# Patient Record
Sex: Male | Born: 1965 | Race: Black or African American | Hispanic: No | Marital: Single
Health system: Northeastern US, Academic
[De-identification: ages and names within clinical notes are randomized; demographics above are authoritative.]

## PROBLEM LIST (undated history)

## (undated) DIAGNOSIS — Z87891 Personal history of nicotine dependence: Secondary | ICD-10-CM

## (undated) DIAGNOSIS — Z87898 Personal history of other specified conditions: Secondary | ICD-10-CM

## (undated) DIAGNOSIS — I10 Essential (primary) hypertension: Secondary | ICD-10-CM

## (undated) DIAGNOSIS — I219 Acute myocardial infarction, unspecified: Secondary | ICD-10-CM

## (undated) DIAGNOSIS — E7800 Pure hypercholesterolemia, unspecified: Secondary | ICD-10-CM

---

## 2014-07-09 ENCOUNTER — Encounter (HOSPITAL_COMMUNITY): Payer: Self-pay | Admitting: *Deleted

## 2014-07-09 ENCOUNTER — Observation Stay (HOSPITAL_COMMUNITY)
Admission: EM | Admit: 2014-07-09 | Discharge: 2014-07-11 | Disposition: A | Discharge status: Home / Self Care | Payer: Self-pay | Attending: Internal Medicine | Admitting: Internal Medicine

## 2014-07-09 ENCOUNTER — Emergency Department (HOSPITAL_COMMUNITY)
Admission: EM | Admit: 2014-07-09 | Discharge: 2014-07-09 | Disposition: A | Discharge status: Home / Self Care | Payer: Self-pay | Attending: Emergency Medicine | Admitting: Emergency Medicine

## 2014-07-09 ENCOUNTER — Encounter (HOSPITAL_COMMUNITY): Payer: Self-pay | Admitting: Emergency Medicine

## 2014-07-09 DIAGNOSIS — I252 Old myocardial infarction: Secondary | ICD-10-CM | POA: Insufficient documentation

## 2014-07-09 DIAGNOSIS — Z7982 Long term (current) use of aspirin: Secondary | ICD-10-CM | POA: Insufficient documentation

## 2014-07-09 DIAGNOSIS — Z8639 Personal history of other endocrine, nutritional and metabolic disease: Secondary | ICD-10-CM | POA: Insufficient documentation

## 2014-07-09 DIAGNOSIS — I10 Essential (primary) hypertension: Secondary | ICD-10-CM | POA: Insufficient documentation

## 2014-07-09 DIAGNOSIS — Z79899 Other long term (current) drug therapy: Secondary | ICD-10-CM | POA: Insufficient documentation

## 2014-07-09 DIAGNOSIS — Z87891 Personal history of nicotine dependence: Secondary | ICD-10-CM | POA: Insufficient documentation

## 2014-07-09 DIAGNOSIS — R0789 Other chest pain: Secondary | ICD-10-CM | POA: Insufficient documentation

## 2014-07-09 DIAGNOSIS — F39 Unspecified mood [affective] disorder: Secondary | ICD-10-CM | POA: Diagnosis present

## 2014-07-09 DIAGNOSIS — E780 Pure hypercholesterolemia: Secondary | ICD-10-CM | POA: Insufficient documentation

## 2014-07-09 DIAGNOSIS — I2510 Atherosclerotic heart disease of native coronary artery without angina pectoris: Secondary | ICD-10-CM | POA: Diagnosis present

## 2014-07-09 DIAGNOSIS — I25119 Atherosclerotic heart disease of native coronary artery with unspecified angina pectoris: Secondary | ICD-10-CM

## 2014-07-09 DIAGNOSIS — R079 Chest pain, unspecified: Principal | ICD-10-CM | POA: Diagnosis present

## 2014-07-09 HISTORY — DX: Essential (primary) hypertension: I10

## 2014-07-09 HISTORY — DX: Acute myocardial infarction, unspecified: I21.9

## 2014-07-09 HISTORY — DX: Personal history of nicotine dependence: Z87.891

## 2014-07-09 HISTORY — DX: Personal history of other specified conditions: Z87.898

## 2014-07-09 HISTORY — DX: Pure hypercholesterolemia, unspecified: E78.00

## 2014-07-09 LAB — CBC
HCT: 41.7 % (ref 39.0–52.0)
HEMOGLOBIN: 14.4 g/dL (ref 13.0–17.0)
MCH: 32.1 pg (ref 26.0–34.0)
MCHC: 34.5 g/dL (ref 30.0–36.0)
MCV: 93.1 fL (ref 78.0–100.0)
Platelets: 247 10*3/uL (ref 150–400)
RBC: 4.48 MIL/uL (ref 4.22–5.81)
RDW: 13.8 % (ref 11.5–15.5)
WBC: 7.8 10*3/uL (ref 4.0–10.5)

## 2014-07-09 LAB — BASIC METABOLIC PANEL
Anion gap: 11 (ref 5–15)
BUN: 12 mg/dL (ref 6–23)
CO2: 25 meq/L (ref 19–32)
Calcium: 9.6 mg/dL (ref 8.4–10.5)
Chloride: 101 mEq/L (ref 96–112)
Creatinine, Ser: 0.92 mg/dL (ref 0.50–1.35)
GFR calc non Af Amer: >90 mL/min (ref >90)
Glucose, Bld: 111 mg/dL — ABNORMAL HIGH (ref 70–99)
POTASSIUM: 4.1 meq/L (ref 3.7–5.3)
Sodium: 137 mEq/L (ref 137–147)

## 2014-07-09 LAB — I-STAT TROPONIN, ED: Troponin i, poc: 0.01 ng/mL (ref 0.00–0.08)

## 2014-07-09 LAB — TROPONIN I: Troponin I: <0.3 ng/mL (ref <0.30)

## 2014-07-09 MED ORDER — MORPHINE SULFATE 2 MG/ML IJ SOLN: 2.0000 mg | INTRAMUSCULAR | Status: DC | PRN

## 2014-07-09 MED ORDER — MORPHINE SULFATE 4 MG/ML IJ SOLN
4.0000 mg | INTRAMUSCULAR | Status: DC | PRN
  Administered 2014-07-09 (×2): 4 mg via INTRAVENOUS
  Filled 2014-07-09 (×2): qty 1

## 2014-07-09 MED ORDER — NITROGLYCERIN 0.4 MG SL SUBL
0.4000 mg | SUBLINGUAL_TABLET | SUBLINGUAL | Status: DC | PRN
  Administered 2014-07-09: 0.4 mg via SUBLINGUAL
  Filled 2014-07-09: qty 1

## 2014-07-09 MED ORDER — MORPHINE SULFATE 4 MG/ML IJ SOLN
4.0000 mg | Freq: Once | INTRAMUSCULAR | Status: AC
  Administered 2014-07-09: 4 mg via INTRAVENOUS
  Filled 2014-07-09: qty 1

## 2014-07-09 MED ORDER — ACETAMINOPHEN 325 MG PO TABS: 650.0000 mg | ORAL_TABLET | ORAL | Status: DC | PRN

## 2014-07-09 MED ORDER — MORPHINE SULFATE 4 MG/ML IJ SOLN
4.0000 mg | Freq: Once | INTRAMUSCULAR | Status: AC
Start: 2014-07-09 — End: 2014-07-09
  Administered 2014-07-09: 4 mg via INTRAVENOUS
  Filled 2014-07-09: qty 1

## 2014-07-09 MED ORDER — NITROGLYCERIN 0.4 MG SL SUBL
0.4000 mg | SUBLINGUAL_TABLET | SUBLINGUAL | Status: DC | PRN
  Administered 2014-07-09 (×3): 0.4 mg via SUBLINGUAL

## 2014-07-09 MED ORDER — ALPRAZOLAM 0.25 MG PO TABS: 0.2500 mg | ORAL_TABLET | Freq: Two times a day (BID) | ORAL | Status: DC | PRN

## 2014-07-09 MED ORDER — ASPIRIN EC 81 MG PO TBEC
81.0000 mg | DELAYED_RELEASE_TABLET | Freq: Every day | ORAL | Status: DC
  Administered 2014-07-10 – 2014-07-11 (×2): 81 mg via ORAL
  Filled 2014-07-09 (×2): qty 1

## 2014-07-09 MED ORDER — PRASUGREL HCL 10 MG PO TABS
10.0000 mg | ORAL_TABLET | Freq: Every day | ORAL | Status: DC
  Administered 2014-07-10 – 2014-07-11 (×2): 10 mg via ORAL
  Filled 2014-07-09 (×2): qty 1

## 2014-07-09 MED ORDER — HEPARIN (PORCINE) IN NACL 100-0.45 UNIT/ML-% IJ SOLN
1000.0000 [IU]/h | INTRAMUSCULAR | Status: DC
  Administered 2014-07-09: 900 [IU]/h via INTRAVENOUS
  Filled 2014-07-09 (×2): qty 250

## 2014-07-09 MED ORDER — ONDANSETRON HCL 4 MG/2ML IJ SOLN
4.0000 mg | Freq: Four times a day (QID) | INTRAMUSCULAR | Status: DC | PRN
  Administered 2014-07-11: 4 mg via INTRAVENOUS
  Filled 2014-07-09: qty 2

## 2014-07-09 MED ORDER — ASPIRIN 81 MG PO CHEW
324.0000 mg | CHEWABLE_TABLET | Freq: Once | ORAL | Status: AC
  Administered 2014-07-09: 324 mg via ORAL
  Filled 2014-07-09: qty 4

## 2014-07-09 MED ORDER — HEPARIN BOLUS VIA INFUSION
4000.0000 [IU] | Freq: Once | INTRAVENOUS | Status: AC
  Administered 2014-07-09: 4000 [IU] via INTRAVENOUS
  Filled 2014-07-09: qty 4000

## 2014-07-09 NOTE — ED Notes (Signed)
Pt placed on monitor, continuous pulse oximetry and blood pressure cuff 

## 2014-07-09 NOTE — ED Notes (Signed)
Patient reports he has chest pain in left side of chest, that shoots, rating 9/10. He has an MI last month and had cardiac stent placed.  Hx of HTN and melanoma.  [facility]

## 2014-07-09 NOTE — ED Provider Notes (Signed)
Patient seen here earlier today and released complaining of chest pain which started a prospect 2 AM today. After discharge patient walked 3 city blocks became sweaty, and lightheaded, return here for further evaluation pain has been constant since 2 AM today left-sided with a mild pleuritic component worse with exertion improved with remaining still pain is moderate to severe present patient is anxious appearing lungs clear auscultation heart regular rate and rhythm abdomen nondistended nontender  Date: 07/09/2014 1606  Rate: 85  Rhythm: normal sinus rhythm  QRS Axis: normal  Intervals: normal  ST/T Wave abnormalities: Inferior ischemic changes new from EKG from earlier today  Conduction Disutrbances:none  Narrative Interpretation:   Old EKG Reviewed: changes noted   Doug SouSam Julianne Chamberlin, MD 07/09/14 1943

## 2014-07-09 NOTE — ED Notes (Signed)
The pt is asleep 

## 2014-07-09 NOTE — ED Notes (Signed)
Pt was just dc home from our department, was trying to find a ride home when pt became lightheaded, dizzy and having chest pains again. No acute distress noted at triage.

## 2014-07-09 NOTE — ED Notes (Signed)
Pt getting undressed and into a gown at this time 

## 2014-07-09 NOTE — Discharge Instructions (Signed)
As discussed, your evaluation today has been largely reassuring.  But, it is important that you monitor your condition carefully, and do not hesitate to return to the ED if you develop new, or concerning changes in your condition. ° °Otherwise, please follow-up with your physician for appropriate ongoing care. ° °Chest Pain (Nonspecific) °It is often hard to give a diagnosis for the cause of chest pain. There is always a chance that your pain could be related to something serious, such as a heart attack or a blood clot in the lungs. You need to follow up with your doctor. °HOME CARE °· If antibiotic medicine was given, take it as directed by your doctor. Finish the medicine even if you start to feel better. °· For the next few days, avoid activities that bring on chest pain. Continue physical activities as told by your doctor. °· Do not use any tobacco products. This includes cigarettes, chewing tobacco, and e-cigarettes. °· Avoid drinking alcohol. °· Only take medicine as told by your doctor. °· Follow your doctor's suggestions for more testing if your chest pain does not go away. °· Keep all doctor visits you made. °GET HELP IF: °· Your chest pain does not go away, even after treatment. °· You have a rash with blisters on your chest. °· You have a fever. °GET HELP RIGHT AWAY IF:  °· You have more pain or pain that spreads to your arm, neck, jaw, back, or belly (abdomen). °· You have shortness of breath. °· You cough more than usual or cough up blood. °· You have very bad back or belly pain. °· You feel sick to your stomach (nauseous) or throw up (vomit). °· You have very bad weakness. °· You pass out (faint). °· You have chills. °This is an emergency. Do not wait to see if the problems will go away. Call your local emergency services (911 in [place] Do not drive yourself to the hospital. °MAKE SURE YOU:  °· Understand these instructions. °· Will watch your condition. °· Will get help right away if you are not doing  well or get worse. °Document Released: 12/25/2007 Document Revised: 07/13/2013 Document Reviewed: 12/25/2007 °ExitCare® Patient Information ©2015 [employer]. This information is not intended to replace advice given to you by your health care provider. Make sure you discuss any questions you have with your health care provider. ° °

## 2014-07-09 NOTE — Progress Notes (Signed)
ANTICOAGULATION CONSULT NOTE - Initial Consult  Pharmacy Consult for heparin Indication: chest pain/ACS  No Known Allergies  Patient Measurements: Height: 5\' 10"  (177.8 cm) Weight: 160 lb (72.576 kg) IBW/kg (Calculated) : 73  Vital Signs: Temp: 97.4 F (36.3 C) (12/19 1546) BP: 103/71 mmHg (12/19 2249) Pulse Rate: 73 (12/19 2249)  Labs:  Recent Labs  07/09/14 0706 07/09/14 1125 07/09/14 1931  HGB 14.4  --   --   HCT 41.7  --   --   PLT 247  --   --   CREATININE 0.92  --   --   TROPONINI  --  <0.30 <0.30    Estimated Creatinine Clearance: 99.7 mL/min (by C-G formula based on Cr of 0.92).   Medical History: Past Medical History  Diagnosis Date  . Hypertension   . Acute MI   . High cholesterol   . Former consumption of alcohol   . Former cigar smoker     Medications:  Prescriptions prior to admission  Medication Sig Dispense Refill Last Dose  . aspirin EC 81 MG tablet Take 81 mg by mouth daily.     . haloperidol (HALDOL) 0.5 MG tablet Take 0.5 mg by mouth 2 (two) times daily.     Marland Kitchen. HYDROCHLOROTHIAZIDE PO Take by mouth.     Marland Kitchen. LITHIUM PO Take by mouth.     . Prasugrel HCl (EFFIENT PO) Take by mouth.      Scheduled:  . [START ON 07/10/2014] aspirin EC  81 mg Oral Daily  . [START ON 07/10/2014] prasugrel  10 mg Oral Daily    Assessment: 48yo male c/o CP 12mo s/p MI/stent, was discharged from ED this morning after several hours of cardiac monitoring and no evidence of ongoing coronary ischemia, was waiting for a ride home and was walking away from hospital when he became lightheaded and dizzy and CP returned, troponin negative x3, now to be admitted and to begin heparin.  Goal of Therapy:  Heparin level 0.3-0.7 units/ml Monitor platelets by anticoagulation protocol: Yes   Plan:  Will give heparin 4000 units x1 followed by gtt at 900 units/hr and monitor heparin levels and CBC.  Vernard GamblesVeronda Ilsa Bonello, PharmD, BCPS  07/09/2014,11:24 PM

## 2014-07-09 NOTE — ED Notes (Signed)
drf patel at th   ,m./

## 2014-07-09 NOTE — ED Provider Notes (Signed)
CSN: [id number]     Arrival date & time 07/09/14  0654 History   First MD Initiated Contact with Patient 07/09/14 0703     Chief Complaint  Patient presents with  . Chest Pain     HPI  Patient presents for evaluation after awakening with chest pain.. Patient awoke approximately 5 hours ago.  Since onset pain has been consistent, left upper chest, severe, sore, nonradiating. No relief with anything. No exertional or pleuritic changes. Patient took his daily medication, including antiplatelet medication. Patient notes that he has been generally well. 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 Patient lives at a residential facility. He denies substance abuse.   Past Medical History  Diagnosis Date  . Hypertension   . Acute MI   . High cholesterol    No past surgical history on file. No family history on file. History  Substance Use Topics  . Smoking status: Not on file  . Smokeless tobacco: Not on file  . Alcohol Use: Not on file    Review of Systems  Constitutional:       Per HPI, otherwise negative  HENT:       Per HPI, otherwise negative  Respiratory:       Per HPI, otherwise negative  Cardiovascular:       Per HPI, otherwise negative  Gastrointestinal: Negative for vomiting.  Endocrine:       Negative aside from HPI  Genitourinary:       Neg aside from HPI   Musculoskeletal:       Per HPI, otherwise negative  Skin: Negative.   Neurological: Negative for syncope.  Psychiatric/Behavioral:       No recent substance abuse      Allergies  Review of patient's allergies indicates no known allergies.  Home Medications   Prior to Admission medications   Not on File   BP 151/97 mmHg  Pulse 93  Temp(Src) 98.8 F (37.1 C) (Oral)  Resp 18  Ht 5\' 10"  (1.778 m)  Wt 160 lb (72.576 kg)  BMI 22.96 kg/m2  SpO2 96% Physical Exam  Constitutional: He is oriented to person, place, and time. He appears  well-developed. No distress.  HENT:  Head: Normocephalic and atraumatic.  Eyes: Conjunctivae and EOM are normal.  Cardiovascular: Normal rate and regular rhythm.   Pulmonary/Chest: Effort normal. No stridor. No respiratory distress.  Abdominal: He exhibits no distension.  Musculoskeletal: He exhibits no edema.  Neurological: He is alert and oriented to person, place, and time.  Skin: Skin is warm and dry.  Psychiatric: He has a normal mood and affect.  Nursing note and vitals reviewed.   ED Course  Procedures (including critical care time) Labs Review Labs Reviewed  BASIC METABOLIC PANEL - Abnormal; Notable for the following:    Glucose, Bld 111 (*)    All other components within normal limits  CBC  TROPONIN I  I-STAT TROPOININ, ED     EKG Interpretation   Date/Time:  Saturday July 09 2014 07:01:04 EST Ventricular Rate:  93 PR Interval:  180 QRS Duration: 96 QT Interval:  331 QTC Calculation: 412 R Axis:   78 Text Interpretation:  Age not entered, assumed to be  48 years old for  purpose of ECG interpretation Sinus rhythm Anterior infarct, acute (LAD)  Baseline wander in lead(s) V1 Sinus rhythm ST-t wave abnormality in  anterior leads, without depressions Artifact Abnormal ekg Confirmed by  Gerhard MunchLOCKWOOD, Shelton Soler  MD [phone]([id number]) on 07/09/2014 7:13:03 AM     After the initial evaluation I discussed this case with cardiology team given the abnormal EKG.  With no concurrent depressions, code STEMI was not called.  Patient received SLNG x2, and morphine after initial eval.  Update: Patient on phone   Update: Patien t on phone  Update: Patient on phone  Update: Patient on phone  12:23 PM Patient in no distress. Second troponin is normal. Patient is in no distress, with no complaints.   MDM    This patient with history of prior coronary intervention presents with chest pain.  Here the patient is in no distress, and for several hours of monitoring has no evidence  for ongoing coronary ischemia, either via monitor or laboratory evaluation. Patient was discharged in stable condition to follow-up with his cardiologist.  Gerhard Munchobert Sahir Tolson, MD 07/09/14 1225

## 2014-07-09 NOTE — ED Notes (Signed)
Rep;ort given to rn on 2h 

## 2014-07-09 NOTE — ED Notes (Signed)
Pt asking for another  Sandwich.  given

## 2014-07-09 NOTE — ED Notes (Signed)
The pt reports that his pain is no better but he was sleeping when i came into the room.  Food given

## 2014-07-09 NOTE — ED Provider Notes (Signed)
CSN: [id number]     Arrival date & time 07/09/14  1535 History   First MD Initiated Contact with Patient 07/09/14 1850     Chief Complaint  Patient presents with  . Dizziness     (Consider location/radiation/quality/duration/timing/severity/associated sxs/prior Treatment) Patient is a 48 y.o. male presenting with dizziness. The history is provided by the patient.  Dizziness Quality:  Lightheadedness Severity:  Moderate Onset quality:  Sudden Duration: 1minute. Timing:  Constant Progression:  Resolved Chronicity:  New Context comment:  Walking Relieved by: resting. Worsened by:  Movement Associated symptoms: chest pain (unchanged from earlier visit today)   Associated symptoms: no diarrhea, no headaches, no nausea, no palpitations, no shortness of breath, no tinnitus and no vomiting     Past Medical History  Diagnosis Date  . Hypertension   . Acute MI   . High cholesterol   . Former consumption of alcohol   . Former cigar smoker    History reviewed. No pertinent past surgical history. History reviewed. No pertinent family history. History  Substance Use Topics  . Smoking status: Never Smoker   . Smokeless tobacco: Not on file  . Alcohol Use: No     Comment: Former user    Review of Systems  Constitutional: Negative for fever, diaphoresis, activity change and appetite change.  HENT: Negative for facial swelling, sore throat, tinnitus, trouble swallowing and voice change.   Eyes: Negative for pain, redness and visual disturbance.  Respiratory: Negative for chest tightness, shortness of breath and wheezing.   Cardiovascular: Positive for chest pain (unchanged from earlier visit today). Negative for palpitations and leg swelling.  Gastrointestinal: Negative for nausea, vomiting, abdominal pain, diarrhea, constipation and abdominal distention.  Endocrine: Negative.   Genitourinary: Negative.  Negative for dysuria, decreased urine volume, scrotal swelling and testicular  pain.  Musculoskeletal: Negative for myalgias, back pain and gait problem.  Skin: Negative.  Negative for rash.  Neurological: Positive for dizziness and light-headedness. Negative for tremors, weakness and headaches.  Psychiatric/Behavioral: Negative for suicidal ideas, hallucinations and self-injury. The patient is not nervous/anxious.       Allergies  Review of patient's allergies indicates no known allergies.  Home Medications   Prior to Admission medications   Medication Sig Start Date End Date Taking? Authorizing Provider  aspirin EC 81 MG tablet Take 81 mg by mouth daily.   Yes Historical Provider, MD  haloperidol (HALDOL) 0.5 MG tablet Take 0.5 mg by mouth 2 (two) times daily.   Yes Historical Provider, MD  HYDROCHLOROTHIAZIDE PO Take by mouth.   Yes Historical Provider, MD  LITHIUM PO Take by mouth.   Yes Historical Provider, MD  Prasugrel HCl (EFFIENT PO) Take by mouth.   Yes Historical Provider, MD   BP 130/88 mmHg  Pulse 79  Temp(Src) 97.4 F (36.3 C)  Resp 14  Ht 5\' 10"  (1.778 m)  Wt 193 lb 5.5 oz (87.7 kg)  BMI 27.74 kg/m2  SpO2 96% Physical Exam  Constitutional: He is oriented to person, place, and time. He appears well-developed and well-nourished. No distress.  HENT:  Head: Normocephalic and atraumatic.  Right Ear: External ear normal.  Left Ear: External ear normal.  Nose: Nose normal.  Mouth/Throat: Oropharynx is clear and moist.  Eyes: Conjunctivae and EOM are normal. Pupils are equal, round, and reactive to light. No scleral icterus.  Neck: Normal range of motion. Neck supple. No JVD present. No tracheal deviation present. No thyromegaly present.  Cardiovascular: Normal rate and intact distal pulses.  Exam reveals no gallop and no friction rub.   No murmur heard. Pulmonary/Chest: Effort normal and breath sounds normal. No stridor. No respiratory distress. He has no wheezes. He has no rales.  Abdominal: Soft. He exhibits no distension. There is  tenderness (brusing to right flank tender to palpation. No other abdominal tenderness). There is no rebound and no guarding.  Musculoskeletal: Normal range of motion. He exhibits no edema or tenderness.  Neurological: He is alert and oriented to person, place, and time. No cranial nerve deficit. He exhibits normal muscle tone. Coordination normal.  5/5 strength in all 4 extremities. Normal Gait.   Skin: Skin is warm and dry. No rash noted. He is not diaphoretic.  Psychiatric: He has a normal mood and affect. His behavior is normal.  Nursing note and vitals reviewed.   ED Course  Procedures (including critical care time) Labs Review Labs Reviewed  MRSA PCR SCREENING  TROPONIN I  CBC WITH DIFFERENTIAL  COMPREHENSIVE METABOLIC PANEL  TSH  PROTIME-INR  APTT  D-DIMER, QUANTITATIVE  CK TOTAL AND CKMB  URINE RAPID DRUG SCREEN (HOSP PERFORMED)  TROPONIN I  LITHIUM LEVEL  HEPARIN LEVEL (UNFRACTIONATED)  CBC    Imaging Review No results found.   EKG Interpretation None      MDM   Final diagnoses:  Chest pain    Patient is a 48 y.o. M with CAD s/p PCI 2 months ago who presents for CP and dizziness. EKG shows inverted T waves in inferior leads changed from ekg from earlier today. Troponin still undetectable. ASA and NG given. Patient admitted to hospitalist service for further management.  Patient seen with attending, Dr. Ethelda ChickJacubowitz, who oversaw clinical decision making.     Lula OlszewskiMike Goebel, MD 07/10/14 [place]0023  Doug SouSam Samiel Peel, MD 07/10/14 [phone]

## 2014-07-09 NOTE — ED Notes (Signed)
Med given pt asking  For food

## 2014-07-09 NOTE — ED Notes (Signed)
Pt reports that he was waiting for his ride and that he went to walk out the door when he became dizzy and "passed out." Asked pt if the front ED staff had to pick him up, he said "No I picked myself up, I was right outside." Asked pt exactly where he fell outside our doors so that we can look at camera to see the circumstances of the event, pt reports that he walked 2-3 blocks away when incident happened. Pt AAOx3, speech clear, tongue, midline, equal grips.

## 2014-07-09 NOTE — ED Notes (Signed)
Dr. Jeraldine LootsLockwood at the bedside with review of EKG

## 2014-07-09 NOTE — ED Notes (Signed)
Pt was seen by the ed res.  Pt up walking in the hallway

## 2014-07-09 NOTE — H&P (Signed)
[facility] History and Physical  Patient: Christopher Nixon  GHW:[id number]  DOB: 04/08/1966  DOS: the patient was seen and examined on 07/09/2014 PCP: No PCP Per Patient  Chief Complaint: Chest pain  HPI: Christopher Nixon is a 48 y.o. male with Past medical history of coronary artery disease status post PCI in October at [facility], hypertension, dyslipidemia. The patient presented with complaint of chest pain that started at 2 AM. He was sleeping and he woke up from sleep with this chest pain which is located on the left side. The pain lasted for a few minutes and resolved with rest. He mentions he has been having on and off chest pain since last month or so. But since this morning he continues to have this chest pain. He came to the ER and that his pain was improved and his workup was negative therefore he was discharged home. While he was out in the waiting room he started having complaints of chest pain along with some dizziness and nausea and therefore he'll come back to the ER. At the time of my evaluation he continues to complain of chest pain. He denies any fever or chills denies any nausea or vomiting denies any abdominal pain and denies any diarrhea or burning urination leg swelling orthopnea or recent travel at the time of my evaluation. He mentions he is taking his medications regularly.  The patient is coming from home. And at his baseline independent for most of his ADL.  Review of Systems: as mentioned in the history of present illness.  A Comprehensive review of the other systems is negative.  Past Medical History  Diagnosis Date  . Hypertension   . Acute MI   . High cholesterol   . Former consumption of alcohol   . Former cigar smoker    History reviewed. No pertinent past surgical history. Social History:  reports that he has been smoking.  He does not have any smokeless tobacco history on file. He reports that he does not drink alcohol or use illicit drugs.  No  Known Allergies  History reviewed. No pertinent family history.  Prior to Admission medications   Medication Sig Start Date End Date Taking? Authorizing Provider  aspirin EC 81 MG tablet Take 81 mg by mouth daily.   Yes Historical Provider, MD  haloperidol (HALDOL) 0.5 MG tablet Take 0.5 mg by mouth 2 (two) times daily.   Yes Historical Provider, MD  HYDROCHLOROTHIAZIDE PO Take by mouth.   Yes Historical Provider, MD  LITHIUM PO Take by mouth.   Yes Historical Provider, MD  Prasugrel HCl (EFFIENT PO) Take by mouth.   Yes Historical Provider, MD    Physical Exam: Filed Vitals:   07/09/14 1938 07/09/14 1951 07/09/14 2000 07/09/14 2249  BP: 133/88 123/91 114/67 103/71  Pulse: 75 76 84 73  Temp:      Resp: 17 16 17 15   Height:      Weight:      SpO2: 97% 96% 95% 97%    General: Alert, Awake and Oriented to Time, Place and Person. Appear in mild distress Eyes: PERRL ENT: Oral Mucosa clear moist. Neck: no JVD Cardiovascular: S1 and S2 Present, no Murmur, Peripheral Pulses Present Respiratory: Bilateral Air entry equal and Decreased, Clear to Auscultation, noCrackles, no wheezes Abdomen: Bowel Sound present, Soft and no tender Skin: no Rash Extremities: no Pedal edema, no calf tenderness Neurologic: Grossly no focal neuro deficit.  Labs on Admission:  CBC:  Recent Labs Lab 07/09/14  0706  WBC 7.8  HGB 14.4  HCT 41.7  MCV 93.1  PLT 247    CMP     Component Value Date/Time   NA 137 07/09/2014 0706   K 4.1 07/09/2014 0706   CL 101 07/09/2014 0706   CO2 25 07/09/2014 0706   GLUCOSE 111* 07/09/2014 0706   BUN 12 07/09/2014 0706   CREATININE 0.92 07/09/2014 0706   CALCIUM 9.6 07/09/2014 0706   GFRNONAA >90 07/09/2014 0706   GFRAA >90 07/09/2014 0706    No results for input(s): LIPASE, AMYLASE in the last 168 hours. No results for input(s): AMMONIA in the last 168 hours.   Recent Labs Lab 07/09/14 1125 07/09/14 1931  TROPONINI <0.30 <0.30   BNP (last 3  results) No results for input(s): PROBNP in the last 8760 hours.  Radiological Exams on Admission: No results found.  EKG: Independently reviewed. normal sinus rhythm initial EKG on 07/09/2014 morning was showing nonspecific ST-T wave changes, repeat EKG on 07/09/2014 evening showed T-wave inversions on 23 aVF as well as biphasic T-wave V6 and V5.  Assessment/Plan Principal Problem:   Chest pain Active Problems:   CAD (coronary artery disease)   Mood disorder   1. Chest pain The patient is presenting with complaints of chest pain. EKG shows T-wave inversions in 23 aVF. 2 sets of troponin so far are negative despite continuous complaint of chest pain. This possibility of ACS is less likely although I discussed the case with cardiology who recommended to completely cover him with heparin anticoagulation if there is no contraindication. At present the patient will be admitted in the step down unit due to ongoing chest pain. I will use nitroglycerin as well as morphine for pain control. Aspirin and Prasugrel. Monitor serial troponin echogram in the morning patient will remain nothing by mouth.  2. Mood disorder. Patient claims he is on lithium and Haldol although lately 2 levels are undetectable. At present holding both medications.  3. Will need to obtain release of information for information from [facility] for the patient so that his medication as well as his procedure can be verified.   Advance goals of care discussion: Full code   DVT Prophylaxis: Therapeutic anticoagulation Nutrition: Nothing by mouth except medication  Disposition: Admitted to observation in step-down unit.  Author: Lynden OxfordPranav Memori Sammon, MD [facility] Pager: [phone] 07/09/2014, 11:15 PM    If 7PM-7AM, please contact night-coverage [email or web address] Password [email or web address]

## 2014-07-09 NOTE — ED Notes (Signed)
Iv started pts pain has not gotten any better

## 2014-07-09 NOTE — ED Notes (Addendum)
Spoke with pt about his issue with getting a ride home. [facility] on [address] [phone], but no answer. Pt reports his friend with whom he has been on the phone with earlier and recently went to the wrong hospital to pick him up when he was discharged. Asked if friend was coming here to visit pt, pt reports, "Her car broke down."

## 2014-07-09 NOTE — ED Notes (Signed)
Pt d/c'd from IV, monitor, continuous pulse oximetry and blood pressure cuff; pt getting dressed to be discharged home 

## 2014-07-09 NOTE — ED Notes (Signed)
Pt talking on the phone; no needs at this time

## 2014-07-10 ENCOUNTER — Observation Stay (HOSPITAL_COMMUNITY): Payer: Self-pay

## 2014-07-10 DIAGNOSIS — R0789 Other chest pain: Secondary | ICD-10-CM

## 2014-07-10 DIAGNOSIS — I369 Nonrheumatic tricuspid valve disorder, unspecified: Secondary | ICD-10-CM

## 2014-07-10 LAB — PROTIME-INR
INR: 1.09 (ref 0.00–1.49)
INR: 1.22 (ref 0.00–1.49)
PROTHROMBIN TIME: 15.5 s — AB (ref 11.6–15.2)
Prothrombin Time: 14.2 seconds (ref 11.6–15.2)

## 2014-07-10 LAB — CBC WITH DIFFERENTIAL/PLATELET
BASOS PCT: 0 % (ref 0–1)
BASOS PCT: 0 % (ref 0–1)
Basophils Absolute: 0 10*3/uL (ref 0.0–0.1)
Basophils Absolute: 0 10*3/uL (ref 0.0–0.1)
EOS PCT: 1 % (ref 0–5)
Eosinophils Absolute: 0.1 10*3/uL (ref 0.0–0.7)
Eosinophils Absolute: 0.2 10*3/uL (ref 0.0–0.7)
Eosinophils Relative: 2 % (ref 0–5)
HCT: 40.3 % (ref 39.0–52.0)
HCT: 41.5 % (ref 39.0–52.0)
HEMOGLOBIN: 14.1 g/dL (ref 13.0–17.0)
Hemoglobin: 13.6 g/dL (ref 13.0–17.0)
LYMPHS ABS: 2.9 10*3/uL (ref 0.7–4.0)
Lymphocytes Relative: 23 % (ref 12–46)
Lymphocytes Relative: 28 % (ref 12–46)
Lymphs Abs: 2.9 10*3/uL (ref 0.7–4.0)
MCH: 32.7 pg (ref 26.0–34.0)
MCH: 31.7 pg (ref 26.0–34.0)
MCHC: 34 g/dL (ref 30.0–36.0)
MCHC: 33.7 g/dL (ref 30.0–36.0)
MCV: 96.3 fL (ref 78.0–100.0)
MCV: 93.9 fL (ref 78.0–100.0)
Monocytes Absolute: 1.1 10*3/uL — ABNORMAL HIGH (ref 0.1–1.0)
Monocytes Absolute: 0.8 10*3/uL (ref 0.1–1.0)
Monocytes Relative: 9 % (ref 3–12)
Monocytes Relative: 8 % (ref 3–12)
NEUTROS ABS: 6.5 10*3/uL (ref 1.7–7.7)
NEUTROS PCT: 62 % (ref 43–77)
NEUTROS PCT: 67 % (ref 43–77)
Neutro Abs: 8.3 10*3/uL — ABNORMAL HIGH (ref 1.7–7.7)
PLATELETS: 252 10*3/uL (ref 150–400)
Platelets: 248 10*3/uL (ref 150–400)
RBC: 4.29 MIL/uL (ref 4.22–5.81)
RBC: 4.31 MIL/uL (ref 4.22–5.81)
RDW: 14.1 % (ref 11.5–15.5)
RDW: 14 % (ref 11.5–15.5)
WBC: 12.5 10*3/uL — ABNORMAL HIGH (ref 4.0–10.5)
WBC: 10.4 10*3/uL (ref 4.0–10.5)

## 2014-07-10 LAB — COMPREHENSIVE METABOLIC PANEL
ALBUMIN: 3.6 g/dL (ref 3.5–5.2)
ALBUMIN: 3.6 g/dL (ref 3.5–5.2)
ALK PHOS: 75 U/L (ref 39–117)
ALT: 14 U/L (ref 0–53)
ALT: 15 U/L (ref 0–53)
ANION GAP: 12 (ref 5–15)
ANION GAP: 11 (ref 5–15)
AST: 21 U/L (ref 0–37)
AST: 21 U/L (ref 0–37)
Alkaline Phosphatase: 73 U/L (ref 39–117)
BUN: 14 mg/dL (ref 6–23)
BUN: 15 mg/dL (ref 6–23)
CHLORIDE: 102 meq/L (ref 96–112)
CO2: 24 mEq/L (ref 19–32)
CO2: 26 mEq/L (ref 19–32)
Calcium: 9.4 mg/dL (ref 8.4–10.5)
Calcium: 9.4 mg/dL (ref 8.4–10.5)
Chloride: 102 mEq/L (ref 96–112)
Creatinine, Ser: 0.78 mg/dL (ref 0.50–1.35)
Creatinine, Ser: 0.85 mg/dL (ref 0.50–1.35)
GFR calc Af Amer: >90 mL/min (ref >90)
GFR calc non Af Amer: >90 mL/min (ref >90)
GFR calc non Af Amer: >90 mL/min (ref >90)
GLUCOSE: 122 mg/dL — AB (ref 70–99)
Glucose, Bld: 88 mg/dL (ref 70–99)
POTASSIUM: 4 meq/L (ref 3.7–5.3)
Potassium: 4.1 mEq/L (ref 3.7–5.3)
SODIUM: 139 meq/L (ref 137–147)
Sodium: 138 mEq/L (ref 137–147)
Total Bilirubin: 0.3 mg/dL (ref 0.3–1.2)
Total Bilirubin: 0.4 mg/dL (ref 0.3–1.2)
Total Protein: 7.5 g/dL (ref 6.0–8.3)
Total Protein: 7.6 g/dL (ref 6.0–8.3)

## 2014-07-10 LAB — MRSA PCR SCREENING: MRSA by PCR: NEGATIVE

## 2014-07-10 LAB — TROPONIN I: Troponin I: <0.3 ng/mL (ref <0.30)

## 2014-07-10 LAB — BASIC METABOLIC PANEL
ANION GAP: 15 (ref 5–15)
BUN: 16 mg/dL (ref 6–23)
CHLORIDE: 99 meq/L (ref 96–112)
CO2: 23 meq/L (ref 19–32)
Calcium: 9.5 mg/dL (ref 8.4–10.5)
Creatinine, Ser: 0.93 mg/dL (ref 0.50–1.35)
GFR calc Af Amer: >90 mL/min (ref >90)
GFR calc non Af Amer: >90 mL/min (ref >90)
Glucose, Bld: 96 mg/dL (ref 70–99)
POTASSIUM: 4.2 meq/L (ref 3.7–5.3)
SODIUM: 137 meq/L (ref 137–147)

## 2014-07-10 LAB — APTT
APTT: 171 s — AB (ref 24–37)
aPTT: 61 seconds — ABNORMAL HIGH (ref 24–37)

## 2014-07-10 LAB — HEPARIN LEVEL (UNFRACTIONATED)
HEPARIN UNFRACTIONATED: 0.3 [IU]/mL (ref 0.30–0.70)
HEPARIN UNFRACTIONATED: 0.27 [IU]/mL — AB (ref 0.30–0.70)
Heparin Unfractionated: 0.31 IU/mL (ref 0.30–0.70)

## 2014-07-10 LAB — CK TOTAL AND CKMB (NOT AT ARMC)
CK TOTAL: 120 U/L (ref 7–232)
CK, MB: 3.2 ng/mL (ref 0.3–4.0)
Relative Index: 2.7 — ABNORMAL HIGH (ref 0.0–2.5)

## 2014-07-10 LAB — LITHIUM LEVEL: Lithium Lvl: <0.25 mEq/L — ABNORMAL LOW (ref 0.80–1.40)

## 2014-07-10 LAB — RAPID URINE DRUG SCREEN, HOSP PERFORMED
Amphetamines: NOT DETECTED
BARBITURATES: NOT DETECTED
BENZODIAZEPINES: NOT DETECTED
Cocaine: NOT DETECTED
Opiates: POSITIVE — AB
Tetrahydrocannabinol: NOT DETECTED

## 2014-07-10 LAB — D-DIMER, QUANTITATIVE: D-Dimer, Quant: 0.34 ug/mL-FEU (ref 0.00–0.48)

## 2014-07-10 LAB — TSH: TSH: 2.66 u[IU]/mL (ref 0.350–4.500)

## 2014-07-10 MED ORDER — HEPARIN (PORCINE) IN NACL 100-0.45 UNIT/ML-% IJ SOLN
1200.0000 [IU]/h | INTRAMUSCULAR | Status: DC
Start: 2014-07-10 — End: 2014-07-11
  Administered 2014-07-10: 1200 [IU]/h via INTRAVENOUS
  Filled 2014-07-10: qty 250

## 2014-07-10 MED ORDER — HALOPERIDOL 0.5 MG PO TABS
0.5000 mg | ORAL_TABLET | Freq: Two times a day (BID) | ORAL | Status: DC
  Administered 2014-07-10 – 2014-07-11 (×2): 0.5 mg via ORAL
  Filled 2014-07-10 (×3): qty 1

## 2014-07-10 NOTE — Progress Notes (Signed)
ANTICOAGULATION CONSULT NOTE - Initial Consult  Pharmacy Consult for heparin Indication: chest pain/ACS  No Known Allergies  Patient Measurements: Height: 5\' 10"  (177.8 cm) Weight: 193 lb 5.5 oz (87.7 kg) IBW/kg (Calculated) : 73    Vital Signs: Temp: 98.6 F (37 C) (12/20 1145) Temp Source: Oral (12/20 1145) BP: 97/64 mmHg (12/20 1145) Pulse Rate: 74 (12/20 1145)  Labs:  Recent Labs  07/09/14 0706  07/09/14 1931 07/09/14 2357 07/10/14 0400 07/10/14 1418  HGB 14.4  --   --  13.6 14.1  --   HCT 41.7  --   --  40.3 41.5  --   PLT 247  --   --  252 248  --   APTT  --   --   --  171* 61*  --   LABPROT  --   --   --  15.5* 14.2  --   INR  --   --   --  1.22 1.09  --   HEPARINUNFRC  --   --   --   --  0.30 0.27*  CREATININE 0.92  --   --  0.85 0.78  --   CKTOTAL  --   --   --  120  --   --   CKMB  --   --   --  3.2  --   --   TROPONINI  --   < > <0.30 <0.30 <0.30  --   < > = values in this interval not displayed.  Estimated Creatinine Clearance: 124.7 mL/min (by C-G formula based on Cr of 0.78).   Medical History: Past Medical History  Diagnosis Date  . Hypertension   . Acute MI   . High cholesterol   . Former consumption of alcohol   . Former cigar smoker     Medications:  Prescriptions prior to admission  Medication Sig Dispense Refill Last Dose  . aspirin EC 81 MG tablet Take 81 mg by mouth daily.     . haloperidol (HALDOL) 0.5 MG tablet Take 0.5 mg by mouth 2 (two) times daily.     Marland Kitchen. HYDROCHLOROTHIAZIDE PO Take by mouth.     Marland Kitchen. LITHIUM PO Take by mouth.     . Prasugrel HCl (EFFIENT PO) Take by mouth.      Scheduled:  . aspirin EC  81 mg Oral Daily  . prasugrel  10 mg Oral Daily    Assessment: 48yo male admitted on 12/19 with c/o CP 42mo s/p MI/stent to continue heparin. HL is now slightly SUBtherapeutic after one level at low end of goal range. CBC remains wnl and stable.   Goal of Therapy:  Heparin level 0.3-0.7 units/ml Monitor platelets by  anticoagulation protocol: Yes   Plan:  - Increase heparin gtt to 1200 u/hr (~2 u/kg/hr increase) - 6 hour HL - Daily HL/CBC - F/u plans for cath

## 2014-07-10 NOTE — Progress Notes (Signed)
Utilization Review Completed.   Elasia Furnish, RN, BSN Nurse Case Manager  

## 2014-07-10 NOTE — Progress Notes (Signed)
ANTICOAGULATION CONSULT NOTE - Follow Up Consult  Pharmacy Consult for heparin Indication: chest pain/ACS  Labs:  Recent Labs  07/09/14 0706  07/09/14 1931 07/09/14 2357 07/10/14 0400 07/10/14 1418 07/10/14 2153  HGB 14.4  --   --  13.6 14.1  --   --   HCT 41.7  --   --  40.3 41.5  --   --   PLT 247  --   --  252 248  --   --   APTT  --   --   --  171* 61*  --   --   LABPROT  --   --   --  15.5* 14.2  --   --   INR  --   --   --  1.22 1.09  --   --   HEPARINUNFRC  --   --   --   --  0.30 0.27* 0.31  CREATININE 0.92  --   --  0.85 0.78  --   --   CKTOTAL  --   --   --  120  --   --   --   CKMB  --   --   --  3.2  --   --   --   TROPONINI  --   < > <0.30 <0.30 <0.30  --   --   < > = values in this interval not displayed.   Assessment/Plan:  48yo male therapeutic on heparin after rate increases. Will continue gtt at current rate and confirm stable with am labs.   Vernard GamblesVeronda Landon Bassford, PharmD, BCPS  07/10/2014,10:57 PM

## 2014-07-10 NOTE — Progress Notes (Signed)
  Echocardiogram 2D Echocardiogram has been performed.  Cathie BeamsGREGORY, Janean Eischen 07/10/2014, 12:05 PM

## 2014-07-10 NOTE — ED Notes (Signed)
I have personally seen and examined the patient.  I have discussed the plan of care with the resident.  I have reviewed the documentation on PMH/FH/Soc. History.  I have reviewed the documentation of the resident and agree.  Doug SouSam Tuyet Bader, MD 07/10/14 [phone]

## 2014-07-10 NOTE — Progress Notes (Signed)
[facility] TEAM 1 - Stepdown/ICU TEAM Progress Note  Christopher IslamLeon Noblett ZOX:[id number] DOB: 1965/09/02 DOA: 07/09/2014 PCP: No PCP Per Patient  Admit HPI / Brief Narrative: Christopher Nixon is a 48 y.o. BM PMHx mood disorder, CAD S/P  [facility], hypertension, dyslipidemia. The patient presented with complaint of chest pain that started at 2 AM. He was sleeping and he woke up from sleep with this chest pain which is located on the left side. The pain lasted for a few minutes and resolved with rest. He mentions he has been having on and off chest pain since last month or so. But since this morning he continues to have this chest pain. He came to the ER and that his pain was improved and his workup was negative therefore he was discharged home. While he was out in the waiting room he started having complaints of chest pain along with some dizziness and nausea and therefore he'll come back to the ER. At the time of my evaluation he continues to complain of chest pain. He denies any fever or chills denies any nausea or vomiting denies any abdominal pain and denies any diarrhea or burning urination leg swelling orthopnea or recent travel at the time of my evaluation. He mentions he is taking his medications regularly.  The patient is coming from home. And at his baseline independent for most of his ADL.  HPI/Subjective: 12/20 patient talking on phone and refuses to hang up to allow me to examine him and properly interviewing. States having just a little bit of chest pain, negative SOB, negative N/V    Assessment/Plan: Chest pain -Although patient states has a little residual CP patient will not get off the phone in order for me to properly examine or interview. In no acute distress. Most likely musculoskeletal. -Although EKG shows T-wave inversions in II, III, aVF, Troponin 3 negative The patient is presenting with complaints of chest pain. -D-dimer within normal limit -Transfer[place] to  [place] and discharge in the a.m. -Continue aspirin 81 mg daily -Continue home dose of Effient 10 mg daily -NTG  PRN CP -Obtain lipid panel -Obtain A1c -TSH  Mood disorder. Patient claims he is on lithium and Haldol although lately 2 levels are undetectable. -Restart home dose Haldol 0.5 mg BID -Although lithium recorded in his MAR, unknown dose so cannot restart.    Code Status: FULL Family Communication: no family present at time of exam Disposition Plan: Discharge in a.m.    Consultants: NA  Procedure/Significant Events: 12/20 echocardiogram;Left ventricle: mild LVH. -LVEF= 60% to 65%.   Culture NA  Antibiotics: NA  DVT prophylaxis: NA   Devices NA   LINES / TUBES:      Continuous Infusions: . heparin 1,200 Units/hr (07/10/14 1514)    Objective: VITAL SIGNS: Temp: 98.4 F (36.9 C) (12/20 1657) Temp Source: Oral (12/20 1657) BP: 109/46 mmHg (12/20 1657) Pulse Rate: 78 (12/20 1657) SPO2; FIO2:   Intake/Output Summary (Last 24 hours) at 07/10/14 1838 Last data filed at 07/10/14 1656  Gross per 24 hour  Intake   96.7 ml  Output   1250 ml  Net -1153.3 ml     Exam: General: A/O 4, NAD, No acute respiratory distress Lungs: Clear to auscultation bilaterally without wheezes or crackles Cardiovascular: Regular rate and rhythm without murmur gallop or rub normal S1 and S2 Abdomen: Nontender, nondistended, soft, bowel sounds positive, no rebound, no ascites, no appreciable mass Extremities: No significant cyanosis, clubbing, or edema  bilateral lower extremities  Data Reviewed: Basic Metabolic Panel:  Recent Labs Lab 07/09/14 0706 07/09/14 2357 07/10/14 0400  NA 137 139 138  K 4.1 4.0 4.1  CL 101 102 102  CO2 25 26 24   GLUCOSE 111* 122* 88  BUN 12 15 14   CREATININE 0.92 0.85 0.78  CALCIUM 9.6 9.4 9.4   Liver Function Tests:  Recent Labs Lab 07/09/14 2357 07/10/14 0400  AST 21 21  ALT 15 14  ALKPHOS 75 73  BILITOT 0.3 0.4    PROT 7.5 7.6  ALBUMIN 3.6 3.6   No results for input(s): LIPASE, AMYLASE in the last 168 hours. No results for input(s): AMMONIA in the last 168 hours. CBC:  Recent Labs Lab 07/09/14 0706 07/09/14 2357 07/10/14 0400  WBC 7.8 12.5* 10.4  NEUTROABS  --  8.3* 6.5  HGB 14.4 13.6 14.1  HCT 41.7 40.3 41.5  MCV 93.1 93.9 96.3  PLT 247 252 248   Cardiac Enzymes:  Recent Labs Lab 07/09/14 1125 07/09/14 1931 07/09/14 2357 07/10/14 0400  CKTOTAL  --   --  120  --   CKMB  --   --  3.2  --   TROPONINI <0.30 <0.30 <0.30 <0.30   BNP (last 3 results) No results for input(s): PROBNP in the last 8760 hours. CBG: No results for input(s): GLUCAP in the last 168 hours.  Recent Results (from the past 240 hour(s))  MRSA PCR Screening     Status: None   Collection Time: 07/09/14 11:29 PM  Result Value Ref Range Status   MRSA by PCR NEGATIVE NEGATIVE Final    Comment:        The GeneXpert MRSA Assay (FDA approved for NASAL specimens only), is one component of a comprehensive MRSA colonization surveillance program. It is not intended to diagnose MRSA infection nor to guide or monitor treatment for MRSA infections.      Studies:  Recent x-ray studies have been reviewed in detail by the Attending Physician  Scheduled Meds:  Scheduled Meds: . aspirin EC  81 mg Oral Daily  . prasugrel  10 mg Oral Daily    Time spent on care of this patient: 40 mins   Drema DallasWOODS, Goddess Gebbia, J , MD   [facility] Office  [phone] Pager [phone]- [phone]  On-Call/Text Page:      [email or web address] [email or web address]      password [email or web address]  If 7PM-7AM, please contact night-coverage [email or web address] Password [email or web address] 07/10/2014, 6:38 PM   LOS: 1 day

## 2014-07-10 NOTE — ED Notes (Signed)
Patient complained of anterior chest pain since this afternoon. Pain has pleuritic component also worse with exertion feels like "heart pain he's had in the past. He left here earlier today after discharge he walked 3 blocks became lightheaded and diaphoretic. Pain continues in ED at present. On exam patient anxious appearing lungs clear auscultation heart regular rate and rhythm abdomen nondistended nontender extremities without edema skin warm and dry  Doug SouSam Maor Meckel, MD 07/10/14 [id number]

## 2014-07-10 NOTE — Progress Notes (Signed)
ANTICOAGULATION CONSULT NOTE - Follow Up Consult  Pharmacy Consult for heparin Indication: chest pain/ACS  Labs:  Recent Labs  07/09/14 0706  07/09/14 1931 07/09/14 2357 07/10/14 0400  HGB 14.4  --   --  13.6 14.1  HCT 41.7  --   --  40.3 41.5  PLT 247  --   --  252 248  APTT  --   --   --  171* 61*  LABPROT  --   --   --  15.5* 14.2  INR  --   --   --  1.22 1.09  HEPARINUNFRC  --   --   --   --  0.30  CREATININE 0.92  --   --  0.85 0.78  CKTOTAL  --   --   --  120  --   CKMB  --   --   --  3.2  --   TROPONINI  --   < > <0.30 <0.30 <0.30  < > = values in this interval not displayed.   Assessment: 48yo male therapeutic on heparin with initial dosing for CP though at very low end of goal.  Goal of Therapy:  Heparin level 0.3-0.7 units/ml   Plan:  Will increase heparin gtt slightly to 1000 units/hr and check level in 6hr.  Christopher Nixon, PharmD, BCPS  07/10/2014,7:20 AM

## 2014-07-11 DIAGNOSIS — R072 Precordial pain: Secondary | ICD-10-CM

## 2014-07-11 LAB — LIPID PANEL
CHOLESTEROL: 155 mg/dL (ref 0–200)
HDL: 56 mg/dL (ref >39)
LDL Cholesterol: 66 mg/dL (ref 0–99)
Total CHOL/HDL Ratio: 2.8 RATIO
Triglycerides: 165 mg/dL — ABNORMAL HIGH (ref <150)
VLDL: 33 mg/dL (ref 0–40)

## 2014-07-11 LAB — CBC
HEMATOCRIT: 42.4 % (ref 39.0–52.0)
Hemoglobin: 14.3 g/dL (ref 13.0–17.0)
MCH: 31.7 pg (ref 26.0–34.0)
MCHC: 33.7 g/dL (ref 30.0–36.0)
MCV: 94 fL (ref 78.0–100.0)
PLATELETS: 271 10*3/uL (ref 150–400)
RBC: 4.51 MIL/uL (ref 4.22–5.81)
RDW: 13.8 % (ref 11.5–15.5)
WBC: 8.3 10*3/uL (ref 4.0–10.5)

## 2014-07-11 LAB — HEPARIN LEVEL (UNFRACTIONATED): Heparin Unfractionated: 0.41 IU/mL (ref 0.30–0.70)

## 2014-07-11 LAB — HEMOGLOBIN A1C
Hgb A1c MFr Bld: 5.8 % — ABNORMAL HIGH (ref <5.7)
Mean Plasma Glucose: 120 mg/dL — ABNORMAL HIGH (ref <117)

## 2014-07-11 LAB — MAGNESIUM: MAGNESIUM: 2 mg/dL (ref 1.5–2.5)

## 2014-07-11 LAB — TSH: TSH: 1.44 u[IU]/mL (ref 0.350–4.500)

## 2014-07-11 MED ORDER — AMOXICILLIN 500 MG PO CAPS
500.0000 mg | ORAL_CAPSULE | Freq: Two times a day (BID) | ORAL | Status: DC
  Filled 2014-07-11 (×2): qty 1

## 2014-07-11 MED ORDER — AMOXICILLIN 500 MG PO CAPS: 500.0000 mg | ORAL_CAPSULE | Freq: Two times a day (BID) | ORAL | Status: AC

## 2014-07-11 MED ORDER — NITROGLYCERIN 0.4 MG SL SUBL: 0.4000 mg | SUBLINGUAL_TABLET | SUBLINGUAL | Status: AC | PRN

## 2014-07-11 NOTE — Progress Notes (Signed)
UR completed 

## 2014-07-11 NOTE — Discharge Instructions (Signed)
Chest Pain (Nonspecific) °It is often hard to give a specific diagnosis for the cause of chest pain. There is always a chance that your pain could be related to something serious, such as a heart attack or a blood clot in the lungs. You need to follow up with your health care provider for further evaluation. °CAUSES  °· Heartburn. °· Pneumonia or bronchitis. °· Anxiety or stress. °· Inflammation around your heart (pericarditis) or lung (pleuritis or pleurisy). °· A blood clot in the lung. °· A collapsed lung (pneumothorax). It can develop suddenly on its own (spontaneous pneumothorax) or from trauma to the chest. °· Shingles infection (herpes zoster virus). °The chest wall is composed of bones, muscles, and cartilage. Any of these can be the source of the pain. °· The bones can be bruised by injury. °· The muscles or cartilage can be strained by coughing or overwork. °· The cartilage can be affected by inflammation and become sore (costochondritis). °DIAGNOSIS  °Lab tests or other studies may be needed to find the cause of your pain. Your health care provider may have you take a test called an ambulatory electrocardiogram (ECG). An ECG records your heartbeat patterns over a 24-hour period. You may also have other tests, such as: °· Transthoracic echocardiogram (TTE). During echocardiography, sound waves are used to evaluate how blood flows through your heart. °· Transesophageal echocardiogram (TEE). °· Cardiac monitoring. This allows your health care provider to monitor your heart rate and rhythm in real time. °· Holter monitor. This is a portable device that records your heartbeat and can help diagnose heart arrhythmias. It allows your health care provider to track your heart activity for several days, if needed. °· Stress tests by exercise or by giving medicine that makes the heart beat faster. °TREATMENT  °· Treatment depends on what may be causing your chest pain. Treatment may include: °· Acid blockers for  heartburn. °· Anti-inflammatory medicine. °· Pain medicine for inflammatory conditions. °· Antibiotics if an infection is present. °· You may be advised to change lifestyle habits. This includes stopping smoking and avoiding alcohol, caffeine, and chocolate. °· You may be advised to keep your head raised (elevated) when sleeping. This reduces the chance of acid going backward from your stomach into your esophagus. °Most of the time, nonspecific chest pain will improve within 2-3 days with rest and mild pain medicine.  °HOME CARE INSTRUCTIONS  °· If antibiotics were prescribed, take them as directed. Finish them even if you start to feel better. °· For the next few days, avoid physical activities that bring on chest pain. Continue physical activities as directed. °· Do not use any tobacco products, including cigarettes, chewing tobacco, or electronic cigarettes. °· Avoid drinking alcohol. °· Only take medicine as directed by your health care provider. °· Follow your health care provider's suggestions for further testing if your chest pain does not go away. °· Keep any follow-up appointments you made. If you do not go to an appointment, you could develop lasting (chronic) problems with pain. If there is any problem keeping an appointment, call to reschedule. °SEEK MEDICAL CARE IF:  °· Your chest pain does not go away, even after treatment. °· You have a rash with blisters on your chest. °· You have a fever. °SEEK IMMEDIATE MEDICAL CARE IF:  °· You have increased chest pain or pain that spreads to your arm, neck, jaw, back, or abdomen. °· You have shortness of breath. °· You have an increasing cough, or you cough   up blood.  You have severe back or abdominal pain.  You feel nauseous or vomit.  You have severe weakness.  You faint.  You have chills. This is an emergency. Do not wait to see if the pain will go away. Get medical help at once. Call your local emergency services (911 in [place] Do not drive  yourself to the hospital. MAKE SURE YOU:   Understand these instructions.  Will watch your condition.  Will get help right away if you are not doing well or get worse. Document Released: 04/17/2005 Document Revised: 07/13/2013 Document Reviewed: 02/11/2008 [facility] Patient Information 2015 [place], [place]. This information is not intended to replace advice given to you by your health care provider. Make sure you discuss any questions you have with your health care provider.  Otitis Media Otitis media is redness, soreness, and inflammation of the middle ear. Otitis media may be caused by allergies or, most commonly, by infection. Often it occurs as a complication of the common cold. SIGNS AND SYMPTOMS Symptoms of otitis media may include:  Earache.  Fever.  Ringing in your ear.  Headache.  Leakage of fluid from the ear. DIAGNOSIS To diagnose otitis media, your health care provider will examine your ear with an otoscope. This is an instrument that allows your health care provider to see into your ear in order to examine your eardrum. Your health care provider also will ask you questions about your symptoms. TREATMENT  Typically, otitis media resolves on its own within 3-5 days. Your health care provider may prescribe medicine to ease your symptoms of pain. If otitis media does not resolve within 5 days or is recurrent, your health care provider may prescribe antibiotic medicines if he or she suspects that a bacterial infection is the cause. HOME CARE INSTRUCTIONS   If you were prescribed an antibiotic medicine, finish it all even if you start to feel better.  Take medicines only as directed by your health care provider.  Keep all follow-up visits as directed by your health care provider. SEEK MEDICAL CARE IF:  You have otitis media only in one ear, or bleeding from your nose, or both.  You notice a lump on your neck.  You are not getting better in 3-5 days.  You feel worse  instead of better. SEEK IMMEDIATE MEDICAL CARE IF:   You have pain that is not controlled with medicine.  You have swelling, redness, or pain around your ear or stiffness in your neck.  You notice that part of your face is paralyzed.  You notice that the bone behind your ear (mastoid) is tender when you touch it. MAKE SURE YOU:   Understand these instructions.  Will watch your condition.  Will get help right away if you are not doing well or get worse. Document Released: 04/12/2004 Document Revised: 11/22/2013 Document Reviewed: 02/02/2013 [facility] Patient Information 2015 [place], [place]. This information is not intended to replace advice given to you by your health care provider. Make sure you discuss any questions you have with your health care provider.

## 2014-07-11 NOTE — Discharge Summary (Signed)
DISCHARGE SUMMARY  Christopher Nixon  MR#: [id number]  DOB:October 09, 1965  Date of Admission: 07/09/2014 Date of Discharge: 07/11/2014  Attending Physician:Breyson Kelm T  Patient's PCP:No PCP Per Patient - [place]  Consults:  None   Disposition: D/C home  Follow-up Appts:     Follow-up Information    [place]   Why:  It is VERY important that you keep your scheduled appointment with your Heart Doctor in January.      Tests Needing Follow-up: - pt needs f/u of his c/o chest discomfort, and to be encouraged to keep his scheduled Cardiolgoy f/u visit in early January 2016  Discharge Diagnoses: Chest pain - atypical   Mood disorder  HTN HLD Otitis Media   Initial presentation: 48 y.o. M w/ Hx mood disorder, [facility], hypertension, and dyslipidemia who presented with complaint of chest pain.  He was sleeping and he woke up from sleep with chest pain located on the left side.  The pain lasted for a few minutes and resolved with rest.  He mentioned he had been having on and off chest pain for a month or so.  He presented to the ER, his pain improved, his workup was negative, and therefore he was discharged home.  While he was out in the waiting room he started having chest pain with some dizziness and nausea and therefore came back to the ER.  Hospital Course:  Chest pain - atypical  Troponin 3 negative - D-dimer within normal limits - continue aspirin 81 mg daily - continue home dose of Effient 10 mg daily - 12/20 TTE noted mild LVH w/ EF 60% to 65% - pain 12/21 very atypical in character (described as sharp stabbing pain in lateral chest wall) - f/u EKG 12/21 w/o acute findings - pt [place] - he is encouraged as to the importance of taking all of his medications and keeping this appointment - he swears to me he has been taking both ASA and Effient religiously  since his recent d/c - he is in no distress whatsoever at the time of his d/c exam   Mood disorder  Patient claims he is on lithium and Haldol although lately 2 levels are undetectable - resume as previously prescribed   HTN BP stable/well controlled   HLD LDL 66  Otitis media  L ear examined using the otoscope - findings c/w mid/mod otitis media - hearing intact - begin amoxicillin oral     Medication List    TAKE these medications        amoxicillin 500 MG capsule  Commonly known as:  AMOXIL  Take 1 capsule (500 mg total) by mouth every 12 (twelve) hours.     aspirin EC 81 MG tablet  Take 81 mg by mouth daily.     haloperidol 0.5 MG tablet  Commonly known as:  HALDOL  Take 0.5 mg by mouth 2 (two) times daily.     hydrochlorothiazide 50 MG tablet  Commonly known as:  HYDRODIURIL  Take 50 mg by mouth daily.     lithium carbonate 150 MG capsule  Take 150 mg by mouth daily.     nitroGLYCERIN 0.4 MG SL tablet  Commonly known as:  NITROSTAT  Place 1 tablet (0.4 mg total) under the tongue every 5 (five) minutes as needed for chest pain.     prasugrel 10 MG Tabs tablet  Commonly known as:  EFFIENT  Take 10 mg by mouth daily.        Day of Discharge BP 131/81 mmHg  Pulse 85  Temp(Src) 98.4 F (36.9 C) (Oral)  Resp 19  Ht 5\' 10"  (1.778 m)  Wt 87.3 kg (192 lb 7.4 oz)  BMI 27.62 kg/m2  SpO2 93%  Physical Exam: General: No acute respiratory distress Lungs: Clear to auscultation bilaterally without wheezes or crackles Cardiovascular: Regular rate and rhythm without murmur gallop or rub normal S1 and S2 Abdomen: Nontender, nondistended, soft, bowel sounds positive, no rebound, no ascites, no appreciable mass Extremities: No significant cyanosis, clubbing, or edema bilateral lower extremities  Basic Metabolic Panel:  Recent Labs Lab 07/09/14 0706 07/09/14 2357 07/10/14 0400 07/10/14 2153 07/11/14 0350  NA 137 139 138 137  --   K 4.1 4.0 4.1 4.2  --   CL  101 102 102 99  --   CO2 25 26 24 23   --   GLUCOSE 111* 122* 88 96  --   BUN 12 15 14 16   --   CREATININE 0.92 0.85 0.78 0.93  --   CALCIUM 9.6 9.4 9.4 9.5  --   MG  --   --   --   --  2.0    Liver Function Tests:  Recent Labs Lab 07/09/14 2357 07/10/14 0400  AST 21 21  ALT 15 14  ALKPHOS 75 73  BILITOT 0.3 0.4  PROT 7.5 7.6  ALBUMIN 3.6 3.6   Coags:  Recent Labs Lab 07/09/14 2357 07/10/14 0400  INR 1.22 1.09   CBC:  Recent Labs Lab 07/09/14 0706 07/09/14 2357 07/10/14 0400 07/11/14 0350  WBC 7.8 12.5* 10.4 8.3  NEUTROABS  --  8.3* 6.5  --   HGB 14.4 13.6 14.1 14.3  HCT 41.7 40.3 41.5 42.4  MCV 93.1 93.9 96.3 94.0  PLT 247 252 248 271    Cardiac Enzymes:  Recent Labs Lab 07/09/14 1125 07/09/14 1931 07/09/14 2357 07/10/14 0400  CKTOTAL  --   --  120  --   CKMB  --   --  3.2  --   TROPONINI <0.30 <0.30 <0.30 <0.30    Recent Results (from the past 240 hour(s))  MRSA PCR Screening     Status: None   Collection Time: 07/09/14 11:29 PM  Result Value Ref Range Status   MRSA by PCR NEGATIVE NEGATIVE Final    Comment:        The GeneXpert MRSA Assay (FDA approved for NASAL specimens only), is one component of a comprehensive MRSA colonization surveillance program. It is not intended to diagnose MRSA infection nor to guide or monitor treatment for MRSA infections.       Time spent in discharge (includes decision making & examination of pt): >30 minutes  07/11/2014, 2:24 PM   Christopher BloodJeffrey T. Cameryn Chrisley, MD [facility] Office  [phone] Pager [phone]  On-Call/Text Page:      [email or web address] [email or web address]      password [email or web address]

## 2014-07-11 NOTE — Progress Notes (Signed)
ANTICOAGULATION CONSULT NOTE  Pharmacy Consult for heparin Indication: chest pain/ACS  No Known Allergies  Patient Measurements: Height: 5\' 10"  (177.8 cm) Weight: 192 lb 7.4 oz (87.3 kg) IBW/kg (Calculated) : 73    Vital Signs: Temp: 98.4 F (36.9 C) (12/21 0840) Temp Source: Oral (12/21 0840) BP: 131/81 mmHg (12/21 0730) Pulse Rate: 85 (12/21 0400)  Labs:  Recent Labs  07/09/14 1931 07/09/14 2357  07/10/14 0400 07/10/14 1418 07/10/14 2153 07/11/14 0350  HGB  --  13.6  --  14.1  --   --  14.3  HCT  --  40.3  --  41.5  --   --  42.4  PLT  --  252  --  248  --   --  271  APTT  --  171*  --  61*  --   --   --   LABPROT  --  15.5*  --  14.2  --   --   --   INR  --  1.22  --  1.09  --   --   --   HEPARINUNFRC  --   --   < > 0.30 0.27* 0.31 0.41  CREATININE  --  0.85  --  0.78  --  0.93  --   CKTOTAL  --  120  --   --   --   --   --   CKMB  --  3.2  --   --   --   --   --   TROPONINI <0.30 <0.30  --  <0.30  --   --   --   < > = values in this interval not displayed.  Estimated Creatinine Clearance: 99.2 mL/min (by C-G formula based on Cr of 0.93).   Assessment: 48yo male admitted on 12/19 with c/o CP x 15mo s/p MI/stent to continue heparin. Heparin level has now been therapeutic x2 on 1200 units/hr.  CBC wnl and stable, no bleeding noted. Per RN, patient pulled out IV line this morning and drip has not yet been resumed  Goal of Therapy:  Heparin level 0.3-0.7 units/ml Monitor platelets by anticoagulation protocol: Yes   Plan:  1. Continue heparin gtt at 1200 u/hr when IV line replaced if decision to continue heparin has been made 2. Daily HL/CBC 3. F/u plans for cath or additional workup 4. Follow for s/s bleeding  Dickie Labarre D. Nickalos Petersen, PharmD, BCPS Clinical Pharmacist Pager: [phone] 07/11/2014 10:02 AM

## 2015-11-26 IMAGING — CR DG CHEST 1V PORT
1 series · 1 of 1 positions shown · non-contrast
Comparison: None.

CLINICAL DATA: Acute onset of sharp stabbing generalized chest
pain. Initial encounter.

EXAM:
PORTABLE CHEST - 1 VIEW

[AP]
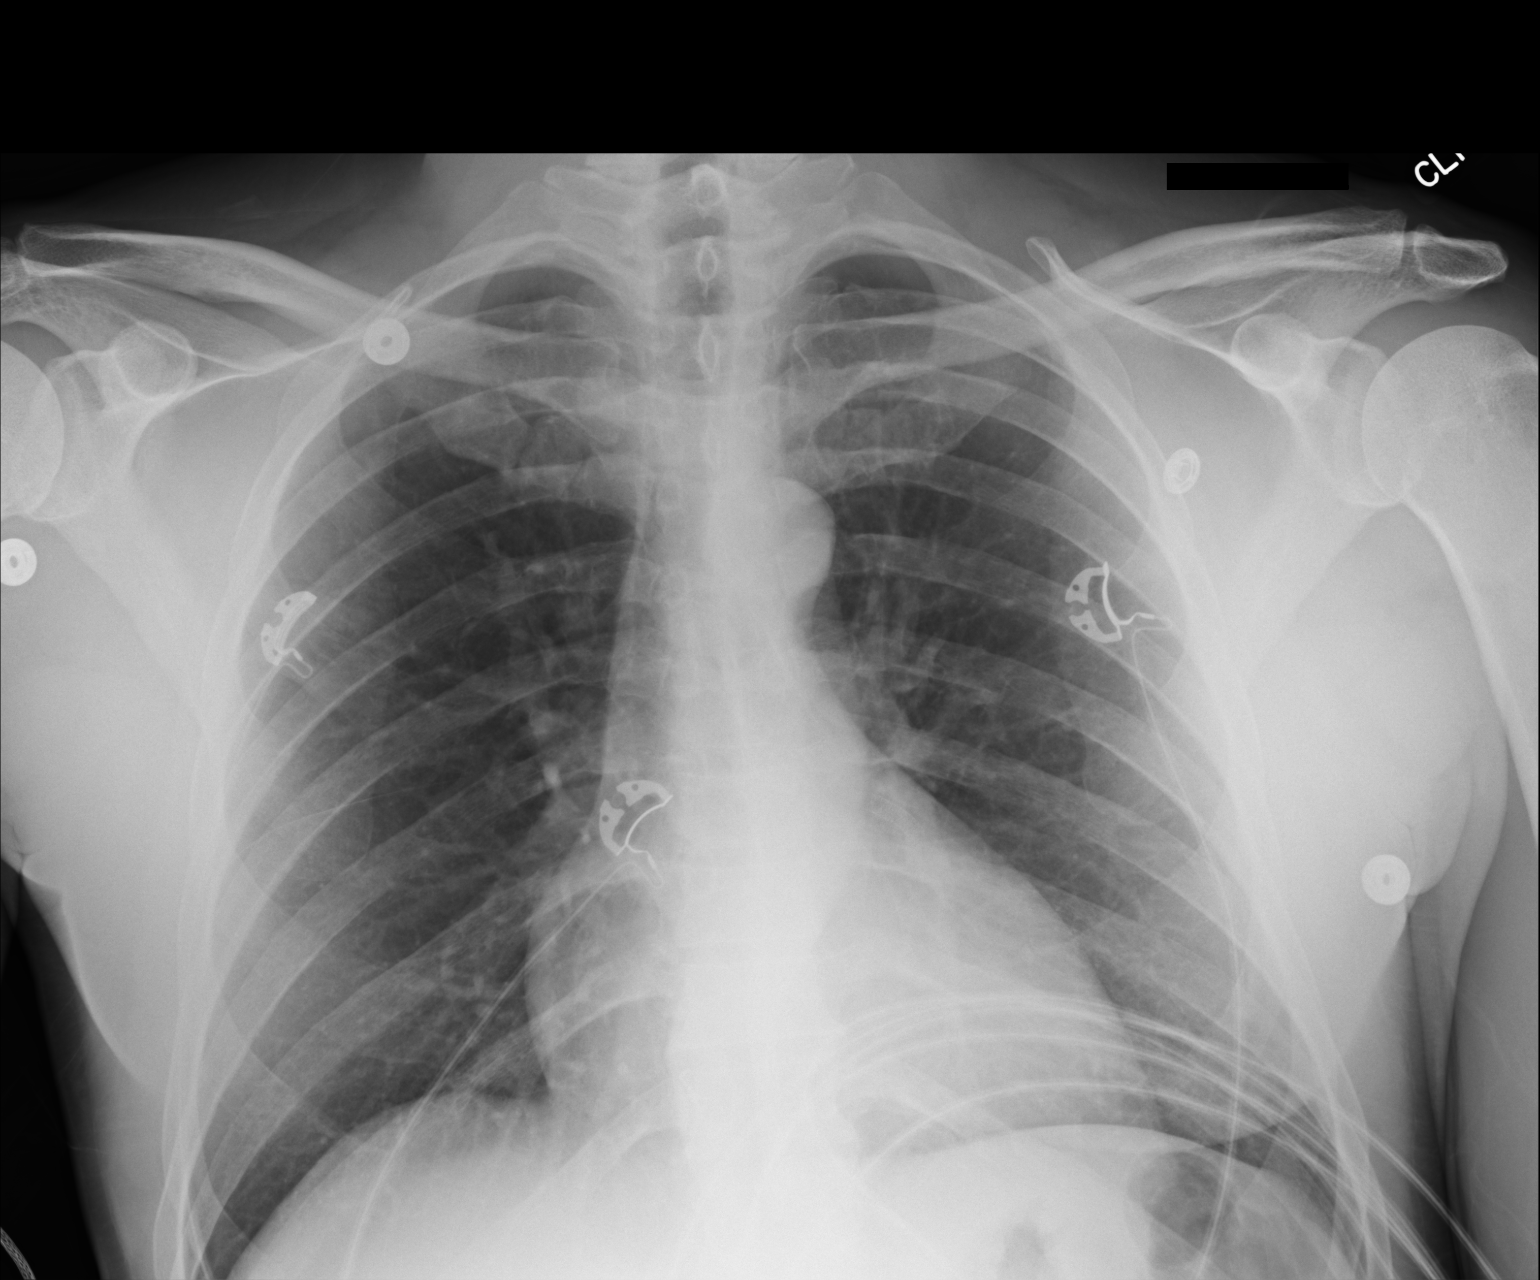

[1 of 1 positions shown; findings below may reference images not displayed]

FINDINGS: The lungs are well-aerated and clear. There is no evidence of focal
opacification, pleural effusion or pneumothorax.

The cardiomediastinal silhouette is within normal limits. No acute
osseous abnormalities are seen.
IMPRESSION: No acute cardiopulmonary process seen. No displaced rib fracture
identified.

## 2017-10-25 ENCOUNTER — Emergency Department
Admit: 2017-10-25 | Disposition: A | Discharge status: Home / Self Care | Payer: No Typology Code available for payment source

## 2017-10-25 ENCOUNTER — Ambulatory Visit: Admitting: Emergency Medicine

## 2017-10-25 LAB — HX POINT OF CARE: HX GLUCOSE-POCT: 81 mg/dL (ref 70–139)

## 2017-10-25 NOTE — ED Provider Notes (Signed)
Carlos Guerrero  Name: Carlos, Guerrero  MRN: [id number]  Age: 52 yrs  Sex: Male  DOB: [birth year]  Arrival Date: 10/25/2017  Arrival Time: 20:55  Account#: [id number]  Bed HA2  PCP: Unknown, Unknown  Chief Complaint: Alcohol Intoxication  .  Presentation:  04/06  20:55 Presenting complaint: EMS states: found sitting on curb so      mt18        bystander called 911. Pt reports ETOH and difficulty        ambulating. Pt reports needs a place to sleep and declined a        ride to shelter. Denies complaints. Care prior to arrival:        Glucose check. 121.  20:55 Method Of Arrival: EMS: [facility]                              mt18  20:55 Acuity: Adult 3                                                 mt18  .  Historical:  - Allergies:  20:56 No known drug Allergies;                                        mt18  - Home Meds:  20:56 None [Active];                                                  mt18  - PMHx:  20:56 Alcoholism; Myocardial infarction;                              mt18  - PSHx:  20:56 Unable to obtain;                                               mt18  .  - Social history: Patient uses alcohol No barriers to    communication noted, The patient speaks fluent [place].  .  .  Screening:  20:56 Safety sceening deferred Privacy compromised. Fall Risk         mt18        Secondary diagnosis (15 points) alcohol intoxication. No IV (0        pts). Ambulatory Aid- None/Bed Rest/Nurse Assist (0 pts). Gait-        Impaired (20 pts.). Mental Status- Overestimates/Forgets        Limitations (15 pts.). Total Morse Fall Scale indicates High        Risk Score (50 or more points). Side Rails Up X 2 Placed Close        to Nursing Station. Exposure Risk/Travel Screening: None        identified.  .  Vital Signs:  20:56 BP 146 / 82; Pulse 91; Resp 16; Temp 36.1(O); Pulse Ox 97% on   mt18  R/A;  22:10 Pulse 88 Monitor; Resp 16 Spontaneous; Pulse Ox 94% on R/A;     mt20  04/07  01:03 BP 122 / 78 Left Arm Sitting (auto/reg); Pulse 108  Monitor;     eb4        Resp 16 Spontaneous; Temp 36.6(O); Pulse Ox 96% on R/A;  .  .  Name:Kaczynski, Shon Hale  [id number]  [id number]  Page 1 of 3  %%PAGE  .  Name: Guerrero, Carlos  MRN: [id number]  Age: 15 yrs  Sex: Male  DOB: [birth year]  Arrival Date: 10/25/2017  Arrival Time: 20:55  Account#: [id number]  Bed HA2  PCP: Unknown, Unknown  Chief Complaint: Alcohol Intoxication  .  Neuro Vital Signs:  04/06  20:56 GCS: 12,                                                        mt18  .  Triage Assessment:  20:56 General: Appears well nourished, Behavior is drowsy. Pain:      mt18        Denies pain. Neuro: Level of Consciousness is lethargic, obeys        commands, Moves all extremities. Speech is slurred, Facial        symmetry appears normal. Cardiovascular: Chest pain is denied.        Respiratory: Airway is patent Respiratory effort is even,        unlabored, Respiratory pattern is regular, symmetrical. Skin:        Skin is pink, warm / dry.  .  Assessment:  21:47 Reassessment: PT presents with ETOH intoxication. Appears in no mt20        distress. Sleeping in bed. Pulse ox monitoring the patient.        General: Appears in no apparent distress, comfortable.        Cardiovascular: Pulses are all present. Respiratory: Airway is        patent Respiratory effort is even, unlabored, Respiratory        pattern is regular, symmetrical. GI: Abdomen is flat, non-        distended. GU: No deficits noted. Skin: Skin is pink, warm /        dry.  22:10 Reassessment: PT sleeping on the stretcher. Appears in no       mt20        apparent distress. Positive rise and fall of the chest.        Continuing to monitor on pulse ox.  04/07  01:00 Reassessment: PT ambulatory with a steady gait to the bathroom. mt20  .  Observations:  04/06  20:55 Patient arrived in ED.                                          mt18  20:56 Triage Completed.                                               mt18  20:57 Patient Visited By: Milly Jakob  sh31  21:55 Patient Visited By: Marion Downer                                edg  21:57 Patient Visited By: Marion Downer                                edg  22:00 Patient assigned to HA2                                         jm42  23:57 Registration completed.                                         dt6  .  Interventions:  20:56 Patient placed on stretcher.                                    mt18  21:01 EMS Sheet Scanned into Chart                                    dw  21:03 POC Test Accucheck POC is 81.                                   ch18  .  Name:Guerrero, Carlos  [id number]  [id number]  Page 2 of 3  %%PAGE  .  Name: Guerrero, Carlos  MRN: [id number]  Age: 40 yrs  Sex: Male  DOB: 08-Nov-1965  Arrival Date: 10/25/2017  Arrival Time: 20:55  Account#: [id number]  Bed HA2  PCP: Unknown, Unknown  Chief Complaint: Alcohol Intoxication  .  21:47 Armband on Call light in reach Bed in low position Side Rail up mt20        X 2. Pulse ox on.  .  Outcome:  21:57 Decision to Hospitalize by Provider.                            edg  04/07  01:05 Hospitalize undone.                                             sh31  01:06 Discharge ordered by MD.                                        sh31  01:09 Discharged to home ambulatory. Condition: stable. Discharge     mt20        instructions given to patient, Instructed on discharge        instructions. Discharge Assessment: Patient awake, alert and        oriented x 3. No cognitive and/or functional deficits noted.        Patient verbalized understanding of disposition instructions.  Chart Status Nursing note complete and electronically signed.  01:09 Patient left the ED.                                            mt20  .  Corrections: (The following items were deleted from the chart)  04/06  20:58 20:56 GCS: 11, mt18                                             mt18  04/07  01:04 01:00 Reassessment: PT ambulatory with a steady gait mt20        mt20  .  Signatures:  Marion Downer                            MD   edg  Asher, Delta                                  dw  Bicknell                         CCT  eb4  Shellia Cleverly                         RN   jm42  Octavia Heir                         RN   mt18  [place], Charles                     CCT  ch18  Rosanne Sack                        RN   mt20  [place]oleon, Djinnie                            dt6  [place], [place]                          PA-C sh31  .  .  .  .  .  .  .  .  .  .  .  Name:Steffler, Jawan  [id number]  [id number]  Page 3 of 3  .  %%END

## 2017-10-25 NOTE — ED Provider Notes (Signed)
Carlos Guerrero Kitchen  Name: Carlos Guerrero, Carlos Guerrero  MRN: [id number]  Age: 52 yrs  Sex: Male  DOB: 17-Apr-1966  Arrival Date: 10/25/2017  Arrival Time: 20:55  Account#: [id number]  .  Working Diagnosis: Alcohol abuse with intoxication  PCP: Unknown, Unknown  .  HPI:  04/66  65:52 52 year old male with history of alcoholism, MI presenting for  sh31        evaluation of alcohol intoxication. History is limited due to        current intoxication. He states that he "messed up and drank a        lot" today. He cannot further quantify alcohol use today. He        denies fall or head injury. He feels mildly nauseous and        believes he vomited once. No abdominal pain. He denies neck,        back, hip, or extremity injury. He was apparently found sitting        on a curb, bystander called EMS. Apparently he was offered a        ride back to the shelter, here he denies staying at a shelter        and states he lives with his girlfriend. He denies any other        substance coingestion. .  .  Historical:  - Allergies: No known drug Allergies;  - Home Meds: None;  - PMHx: Alcoholism; Myocardial infarction;  - PSHx: Unable to obtain;  - Social history: Patient uses alcohol No barriers to    communication noted, The patient speaks fluent [place].  .  .  ROS:  21:11 Unable to obtain Review of Systems due to altered mental        sh31        status.  .  Vital Signs:  20:56 BP 146 / 82; Pulse 91; Resp 16; Temp 36.1(O); Pulse Ox 97% on   mt18        R/A;  22:10 Pulse 88 Monitor; Resp 16 Spontaneous; Pulse Ox 94% on R/A;     mt20  04/07  01:03 BP 122 / 78 Left Arm Sitting (auto/reg); Pulse 108 Monitor;     eb4        Resp 16 Spontaneous; Temp 36.6(O); Pulse Ox 96% on R/A;  .  Neuro Vital Signs:  04/06  20:56 GCS: 12,                                                        mt18  .  Exam:  21:11 Constitutional:  This is a well developed, well nourished       sh31        patient who is sleeping but easily arousable to verbal  .  Name:Carlos Guerrero,  Carlos Guerrero  [id number]  [id number]  Page 1 of 5  %%PAGE  .  Name: Carlos Guerrero, Carlos Guerrero  MRN: [id number]  Age: 23 yrs  Sex: Male  DOB: 13-Sep-1965  Arrival Date: 10/25/2017  Arrival Time: 20:55  Account#: [id number]  .  Working Diagnosis: Alcohol abuse with intoxication  PCP: Unknown, Unknown  .        stimulus. He appears disheveled with alcoholic halitosis        present. Head/Face:  Normocephalic, atraumatic. Eyes:  Pupils  equal round and reactive to light, extra-ocular motions intact.         Lids and lashes normal.  Conjunctiva and sclera are        non-icteric and not injected.  Cornea within normal limits.        Periorbital areas with no swelling, redness, or edema. Neck:        Supple, full range of motion without nuchal rigidity, or        vertebral point tenderness.   Respiratory:  No increased work        of breathing, no retractions or nasal flaring. Chest/axilla:        Normal chest wall appearance and motion.  Nontender with no        deformity.   Abdomen/GI:  Soft, non-tender, with normal bowel        sounds.  No guarding or rebound.  No evidence of tenderness        throughout. MS/ Extremity:  Pulses equal, no cyanosis.        Neurovascular intact.  Full, normal range of motion. Neuro:        Grossly intact, moving all four extremities.  .  MDM:  04/07  01:02 Carlos Guerrero course: The patient was observed here in the Carlos Guerrero until he     sh31        achieved clinical sobriety. He is able to ambulate without        issue and is stable for discharge home. Return precautions were        given. .  .  04/06  20:59 Order name: PATIENTPING STORY                                   dispa  t  04/06  21:10 Order name: Glucose, Point of Care                              dispa  t  04/06  20:58 Order name: FS gluc; Complete Time: 21:03                       mt18  04/06  20:58 Order name: Fall Precautions; Complete Time: 21:03              mt18  04/06  20:58 Order name: Pulse Oximetry Continuous; Complete Time: 21:03      mt18  04/06  21:56 Order name: NPO (Until After Dysphagia Screen); Complete Time:  edg        22:03  04/06  21:56 Order name: Neuro Checks q 2 hours; Complete Time: 22:03        edg  04/06  21:56 Order name: Refer to Carlos Guerrero-OBS; Complete Time: 22:03               edg  04/06  21:56 Order name: Vital signs q 2 hours; Complete Time: 22:03         edg  .  Name:Carlos Guerrero, Carlos Guerrero  [id number]  [id number]  Page 2 of 5  %%PAGE  .  Name: Carlos Guerrero, Carlos Guerrero  MRN: [id number]  Age: 64 yrs  Sex: Male  DOB: 08-06-1965  Arrival Date: 10/25/2017  Arrival Time: 20:55  Account#: [id number]  .  Working Diagnosis: Alcohol abuse with intoxication  PCP: Unknown, Unknown  .  Carlos Guerrero Kitchen  Attending Notes:  04/06  21:55  Attestation: Assessment and care plan reviewed with             edg        resident/midlevel provider. See their note for details.        Physician Assistant's history reviewed, patient interviewed and        examined. I have reviewed the Nurses Notes.  21:57 Lab/Ancillary show: Labs were reviewed and interpreted by me:   edg        accucheck is ok at 121.  22:06 Attending HPI: HPI: pt unable to give me any hx due to ams. he  edg        was found outside and unable to ambulate.  22:07 Attending ROS Unable to obtain due to altered mental status.    edg        Attending Exam: My personal exam reveals Odor of etoh, responds        to pain with brief mumble and eye opening, he has movement in        all ext. head and face-no signs of trauma, PERRL, nose-nl,        lungs=clear bs, heart-rr, abd-soft, nt, no guard or reb, bs        active, ext-no deformities, no rash.  04/07  00:16 Transition of care: After a discussion of the patient's case,   edg        care is transferred to Dr. Wayne Sever Pending: Re-examination and        evaluation of the Patient. My Working Impression: AMS.        Attending chart complete and electronically signed: Marion Downer, MD.  01:08 Transition of care: Care assumed from Dr. Eulah Pont. Attending     ad3        chart  complete and electronically signed: Doran Stabler, MS        MD.  .  Carlos Guerrero Observation:  04/06  21:55 REFER PATIENT TO Carlos Guerrero OBSERVATION STATUS. The patient was         edg        informed of the need for further observational care. Diagnosis:        Acute Mental Status Change. Reason for Carlos Guerrero Observation: At the        time of referral to Carlos Guerrero Observation status, a more precise        diagnosis is needed and further observation and testing is        required for diagnostic accuracy. TREATMENT PLAN: Continuous        Pulse ox and airway monitoring. Serial Neurologic Exams. Family        history The patient is unable to give a family history, due to        medical condition.  04/07  00:15 Progress Note: at this time, pt is unchanged, resting           edg        comfortably. pulse ox is 90% on RA.  01:02 Observation discharge summary: Clinical Course: Patient has     sh31  .  Name:Carlos Guerrero, Carlos Guerrero  [id number]  [id number]  Page 3 of 5  %%PAGE  .  Name: Carlos Guerrero, Carlos Guerrero  MRN: [id number]  Age: 42 yrs  Sex: Male  DOB: [birth year]  Arrival Date: 10/25/2017  Arrival Time: 20:55  Account#: [id number]  .  Working Diagnosis: Alcohol abuse with intoxication  PCP: Unknown, Unknown  .  remained stable in Carlos Guerrero, now clinically sober and able to        ambulate without issue. Will discharge home. My exam shows:        Abdomen exam: Soft and Non-tender. Neuro exam shows: Alert and        oriented x 3. Steady gait. Constitution: Non-toxic and well        hydrated. Diagnosis: Alcohol intoxication. Disposition: Patient        will be discharged from the emergency department.  01:09 Observation discharge summary: Clinical Course:                 ad3        Hospital-year-old male with history of alcohol abuse who        presented with altered mental status. Patient had no physical        signs of trauma after a prolonged period of observation he is        now awake and alert ambulatory with no ataxia and is stable for        discharge My exam  shows: Lung exam: Lungs are clear to        auscultation in all lung fields. Neuro exam shows: Fluent        speech. Steady gait. Moves all extremities equally. Diagnosis:        Acute alcohol intoxication Disposition: Patient will be        discharged from the emergency department.  .  Disposition:  01:02 Chart complete.                                                 sh31  .  Disposition Summary:  10/26/17 01:06  Discharge Ordered        Location: Home                                                  sh31        Problem: new(10/26/17 01:06)                                    sh31        Symptoms: are resolved(10/26/17 01:06)                          sh31        Condition: Stable(10/26/17 01:06)                               sh31        Preliminary Diagnosis          - Alcohol abuse with intoxication(10/26/17 01:06)             sh31        Followup:  sh31          - With: Private Physician          - When: 3 - 5 days          - Reason: As needed        Discharge Instructions:          - Discharge Summary Sheet                                     sh31          - ALCOHOL ABUSE                                               sh31        Forms:          - Medication Reconciliation Form                              sh31  Signatures:  Marion Downer                            MD   edg  Renold Genta                           MD   ad3  Shellia Cleverly                         RN   jm42  .  Name:Carlos Guerrero, Carlos Guerrero  [id number]  [id number]  Page 4 of 5  %%PAGE  .  Name: Carlos Guerrero, Carlos Guerrero  MRN: [id number]  Age: 67 yrs  Sex: Male  DOB: [birth year]  Arrival Date: 10/25/2017  Arrival Time: 20:55  Account#: [id number]  .  Working Diagnosis: Alcohol abuse with intoxication  PCP: Unknown, Unknown  .  Octavia Heir                         RN   mt18  Rosanne Sack                        RN   mt20  Milly Jakob                          PA-C sh31  .  Corrections: (The following items were  deleted from the chart)  04/06  22:00 21:57 edg                                                       jm42  22:10 22:06 Attending HPI: HPI: pt unable to give me any hx due to    edg        ams. edg  04/07  01:05 04/06 21:57 Observation edg  sh31  04/07  01:05 04/06 21:57 Marion Downer edg                                    sh31  04/07  01:05 04/06 21:57 Carlos Guerrero Obsv edg                                         sh31  04/07  01:05 04/06 21:57 Stable edg                                          sh31  04/07  01:05 04/06 21:57 new edg                                             sh31  04/07  01:05 04/06 21:57 are unchanged edg                                   sh31  04/07  01:05 04/06 21:57 Regular edg                                         sh31  04/07  01:05 04/06 21:57 Altered mental status, unspecified edg              sh31  04/07  01:05 04/06 22:00 Carlos Guerrero Obs [id number]                                         sh31  04/07  01:05 01:03 Alcohol abuse with intoxication sh31                      sh31  .  Document is preliminary until electronically or manually signed by the atte  nding physician  .  .  .  .  .  .  .  .  .  .  .  .  Name:Carlos Guerrero, Carlos Guerrero  [id number]  [id number]  Page 5 of 5  .  %%END
